# Patient Record
Sex: Female | Born: 1955 | Race: White | Hispanic: No | Smoking: Former smoker
Health system: Southern US, Community
[De-identification: ages and names within clinical notes are randomized; demographics above are authoritative.]

## PROBLEM LIST (undated history)

## (undated) DIAGNOSIS — J439 Emphysema, unspecified: Secondary | ICD-10-CM

## (undated) DIAGNOSIS — M810 Age-related osteoporosis without current pathological fracture: Secondary | ICD-10-CM

## (undated) DIAGNOSIS — E079 Disorder of thyroid, unspecified: Secondary | ICD-10-CM

## (undated) HISTORY — DX: Disorder of thyroid, unspecified: E07.9

## (undated) HISTORY — PX: TUBAL LIGATION: SHX77

## (undated) HISTORY — PX: APPENDECTOMY: SHX54

## (undated) HISTORY — DX: Age-related osteoporosis without current pathological fracture: M81.0

## (undated) HISTORY — PX: WISDOM TOOTH EXTRACTION: SHX21

## (undated) HISTORY — DX: Emphysema, unspecified: J43.9

---

## 2005-05-28 ENCOUNTER — Ambulatory Visit: Payer: Self-pay | Admitting: Unknown Physician Specialty

## 2007-06-13 ENCOUNTER — Ambulatory Visit: Payer: Self-pay | Admitting: Unknown Physician Specialty

## 2007-07-11 ENCOUNTER — Ambulatory Visit: Payer: Self-pay | Admitting: General Surgery

## 2007-07-14 ENCOUNTER — Ambulatory Visit: Payer: Self-pay | Admitting: Unknown Physician Specialty

## 2007-08-08 ENCOUNTER — Ambulatory Visit: Payer: Self-pay | Admitting: Unknown Physician Specialty

## 2007-08-17 ENCOUNTER — Ambulatory Visit: Payer: Self-pay | Admitting: Unknown Physician Specialty

## 2009-07-20 ENCOUNTER — Encounter (INDEPENDENT_AMBULATORY_CARE_PROVIDER_SITE_OTHER): Payer: Self-pay | Admitting: General Surgery

## 2009-07-20 ENCOUNTER — Inpatient Hospital Stay (HOSPITAL_COMMUNITY): Admission: EM | Admit: 2009-07-20 | Discharge: 2009-07-22 | Payer: Self-pay | Admitting: Emergency Medicine

## 2009-07-25 ENCOUNTER — Inpatient Hospital Stay (HOSPITAL_COMMUNITY): Admission: EM | Admit: 2009-07-25 | Discharge: 2009-08-02 | Payer: Self-pay | Admitting: Emergency Medicine

## 2010-05-25 LAB — URINALYSIS, ROUTINE W REFLEX MICROSCOPIC
Bilirubin Urine: NEGATIVE
Glucose, UA: NEGATIVE mg/dL
Hgb urine dipstick: NEGATIVE
Nitrite: NEGATIVE
Protein, ur: NEGATIVE mg/dL
Urobilinogen, UA: 0.2 mg/dL (ref 0.0–1.0)

## 2010-05-25 LAB — BASIC METABOLIC PANEL
BUN: 4 mg/dL — ABNORMAL LOW (ref 6–23)
CO2: 23 mEq/L (ref 19–32)
CO2: 24 mEq/L (ref 19–32)
CO2: 25 mEq/L (ref 19–32)
Calcium: 8 mg/dL — ABNORMAL LOW (ref 8.4–10.5)
Calcium: 8.3 mg/dL — ABNORMAL LOW (ref 8.4–10.5)
Calcium: 8.5 mg/dL (ref 8.4–10.5)
Calcium: 9.3 mg/dL (ref 8.4–10.5)
Chloride: 105 mEq/L (ref 96–112)
Chloride: 109 mEq/L (ref 96–112)
Chloride: 111 mEq/L (ref 96–112)
Creatinine, Ser: 0.6 mg/dL (ref 0.4–1.2)
Creatinine, Ser: 0.62 mg/dL (ref 0.4–1.2)
Creatinine, Ser: 0.66 mg/dL (ref 0.4–1.2)
GFR calc Af Amer: >60 mL/min (ref >60)
GFR calc Af Amer: >60 mL/min (ref >60)
GFR calc Af Amer: >60 mL/min (ref >60)
GFR calc non Af Amer: >60 mL/min (ref >60)
GFR calc non Af Amer: >60 mL/min (ref >60)
GFR calc non Af Amer: >60 mL/min (ref >60)
GFR calc non Af Amer: >60 mL/min (ref >60)
Glucose, Bld: 105 mg/dL — ABNORMAL HIGH (ref 70–99)
Glucose, Bld: 112 mg/dL — ABNORMAL HIGH (ref 70–99)
Glucose, Bld: 119 mg/dL — ABNORMAL HIGH (ref 70–99)
Glucose, Bld: 122 mg/dL — ABNORMAL HIGH (ref 70–99)
Glucose, Bld: 128 mg/dL — ABNORMAL HIGH (ref 70–99)
Potassium: 3.6 mEq/L (ref 3.5–5.1)
Potassium: 4.2 mEq/L (ref 3.5–5.1)
Sodium: 139 mEq/L (ref 135–145)
Sodium: 139 mEq/L (ref 135–145)
Sodium: 140 mEq/L (ref 135–145)
Sodium: 141 mEq/L (ref 135–145)
Sodium: 142 mEq/L (ref 135–145)

## 2010-05-25 LAB — CBC
HCT: 34.8 % — ABNORMAL LOW (ref 36.0–46.0)
HCT: 35.3 % — ABNORMAL LOW (ref 36.0–46.0)
HCT: 37.7 % (ref 36.0–46.0)
Hemoglobin: 11.6 g/dL — ABNORMAL LOW (ref 12.0–15.0)
Hemoglobin: 11.7 g/dL — ABNORMAL LOW (ref 12.0–15.0)
Hemoglobin: 11.9 g/dL — ABNORMAL LOW (ref 12.0–15.0)
Hemoglobin: 13.1 g/dL (ref 12.0–15.0)
MCHC: 33.6 g/dL (ref 30.0–36.0)
MCHC: 34.1 g/dL (ref 30.0–36.0)
MCHC: 34.7 g/dL (ref 30.0–36.0)
MCV: 100 fL (ref 78.0–100.0)
MCV: 99 fL (ref 78.0–100.0)
MCV: 99.7 fL (ref 78.0–100.0)
Platelets: 143 10*3/uL — ABNORMAL LOW (ref 150–400)
Platelets: 275 10*3/uL (ref 150–400)
Platelets: 346 10*3/uL (ref 150–400)
RBC: 3.5 MIL/uL — ABNORMAL LOW (ref 3.87–5.11)
RBC: 3.57 MIL/uL — ABNORMAL LOW (ref 3.87–5.11)
RBC: 4.35 MIL/uL (ref 3.87–5.11)
RDW: 12.7 % (ref 11.5–15.5)
RDW: 12.7 % (ref 11.5–15.5)
RDW: 12.7 % (ref 11.5–15.5)
WBC: 10.5 10*3/uL (ref 4.0–10.5)
WBC: 8.4 10*3/uL (ref 4.0–10.5)
WBC: 9.3 10*3/uL (ref 4.0–10.5)

## 2010-05-25 LAB — DIFFERENTIAL
Basophils Absolute: 0 10*3/uL (ref 0.0–0.1)
Basophils Absolute: 0 10*3/uL (ref 0.0–0.1)
Eosinophils Absolute: 0 10*3/uL (ref 0.0–0.7)
Eosinophils Absolute: 0.2 10*3/uL (ref 0.0–0.7)
Eosinophils Relative: 0 % (ref 0–5)
Eosinophils Relative: 3 % (ref 0–5)
Lymphocytes Relative: 18 % (ref 12–46)
Lymphs Abs: 0.9 10*3/uL (ref 0.7–4.0)
Lymphs Abs: 1.7 10*3/uL (ref 0.7–4.0)
Lymphs Abs: 2.1 10*3/uL (ref 0.7–4.0)
Monocytes Relative: 7 % (ref 3–12)
Neutro Abs: 6.5 10*3/uL (ref 1.7–7.7)
Neutrophils Relative %: 65 % (ref 43–77)
Neutrophils Relative %: 90 % — ABNORMAL HIGH (ref 43–77)

## 2010-05-25 LAB — COMPREHENSIVE METABOLIC PANEL
ALT: 27 U/L (ref 0–35)
AST: 23 U/L (ref 0–37)
Alkaline Phosphatase: 81 U/L (ref 39–117)
BUN: 5 mg/dL — ABNORMAL LOW (ref 6–23)
CO2: 29 mEq/L (ref 19–32)
Calcium: 8.4 mg/dL (ref 8.4–10.5)
Chloride: 93 mEq/L — ABNORMAL LOW (ref 96–112)
GFR calc non Af Amer: >60 mL/min (ref >60)
Glucose, Bld: 121 mg/dL — ABNORMAL HIGH (ref 70–99)
Potassium: 2.6 mEq/L — CL (ref 3.5–5.1)
Total Bilirubin: 1.2 mg/dL (ref 0.3–1.2)

## 2010-05-25 LAB — LIPASE, BLOOD: Lipase: 26 U/L (ref 11–59)

## 2010-05-25 LAB — POCT I-STAT, CHEM 8
BUN: 4 mg/dL — ABNORMAL LOW (ref 6–23)
Calcium, Ion: 1.06 mmol/L — ABNORMAL LOW (ref 1.12–1.32)
Hemoglobin: 14.3 g/dL (ref 12.0–15.0)
Sodium: 134 mEq/L — ABNORMAL LOW (ref 135–145)

## 2010-05-25 LAB — MAGNESIUM: Magnesium: 2.3 mg/dL (ref 1.5–2.5)

## 2011-02-02 ENCOUNTER — Ambulatory Visit: Payer: Self-pay | Admitting: Unknown Physician Specialty

## 2011-03-26 ENCOUNTER — Ambulatory Visit: Payer: Self-pay | Admitting: General Surgery

## 2016-04-23 ENCOUNTER — Other Ambulatory Visit: Payer: Self-pay | Admitting: Internal Medicine

## 2016-04-23 DIAGNOSIS — Z1231 Encounter for screening mammogram for malignant neoplasm of breast: Secondary | ICD-10-CM

## 2016-05-18 ENCOUNTER — Ambulatory Visit
Admission: RE | Admit: 2016-05-18 | Discharge: 2016-05-18 | Disposition: A | Discharge status: Home / Self Care | Payer: Managed Care, Other (non HMO) | Source: Ambulatory Visit | Attending: Internal Medicine | Admitting: Internal Medicine

## 2016-05-18 DIAGNOSIS — Z1231 Encounter for screening mammogram for malignant neoplasm of breast: Secondary | ICD-10-CM

## 2016-12-29 ENCOUNTER — Telehealth: Payer: Self-pay | Admitting: Acute Care

## 2016-12-29 DIAGNOSIS — Z122 Encounter for screening for malignant neoplasm of respiratory organs: Secondary | ICD-10-CM

## 2016-12-29 DIAGNOSIS — Z87891 Personal history of nicotine dependence: Secondary | ICD-10-CM

## 2016-12-30 NOTE — Telephone Encounter (Signed)
Will forward to the lung cancer pool

## 2017-01-05 ENCOUNTER — Encounter: Payer: Self-pay | Admitting: Acute Care

## 2017-01-05 ENCOUNTER — Ambulatory Visit (INDEPENDENT_AMBULATORY_CARE_PROVIDER_SITE_OTHER): Payer: Managed Care, Other (non HMO) | Admitting: Acute Care

## 2017-01-05 ENCOUNTER — Ambulatory Visit (INDEPENDENT_AMBULATORY_CARE_PROVIDER_SITE_OTHER)
Admission: RE | Admit: 2017-01-05 | Discharge: 2017-01-05 | Disposition: A | Discharge status: Home / Self Care | Payer: Managed Care, Other (non HMO) | Source: Ambulatory Visit | Attending: Acute Care | Admitting: Acute Care

## 2017-01-05 DIAGNOSIS — Z87891 Personal history of nicotine dependence: Secondary | ICD-10-CM

## 2017-01-05 DIAGNOSIS — Z122 Encounter for screening for malignant neoplasm of respiratory organs: Secondary | ICD-10-CM

## 2017-01-05 NOTE — Progress Notes (Signed)
Shared Decision Making Visit Lung Cancer Screening Program [phone])   Eligibility:  Age 61 y.o.  Pack Years Smoking History Calculation 77-pack-year smoking history (# packs/per year x # years smoked)  Recent History of coughing up blood  no  Unexplained weight loss? no ( >Than 15 pounds within the last 6 months )  Prior History Lung / other cancer no (Diagnosis within the last 5 years already requiring surveillance chest CT Scans).  Smoking Status Former Smoker  Former Smokers: Years since quit: 9 years  Quit Date: 2009  Visit Components:  Discussion included one or more decision making aids. yes  Discussion included risk/benefits of screening. yes  Discussion included potential follow up diagnostic testing for abnormal scans. yes  Discussion included meaning and risk of over diagnosis. yes  Discussion included meaning and risk of False Positives. yes  Discussion included meaning of total radiation exposure. yes  Counseling Included:  Importance of adherence to annual lung cancer LDCT screening. yes  Impact of comorbidities on ability to participate in the program. yes  Ability and willingness to under diagnostic treatment. yes  Smoking Cessation Counseling:  Current Smokers:   Discussed importance of smoking cessation. No.  Patient is a former smoker  Information about tobacco cessation classes and interventions provided to patient. yes  Patient provided with "ticket" for LDCT Scan. yes  Symptomatic Patient. no  Counseling  Diagnosis Code: Tobacco Use Z72.0  Asymptomatic Patient yes  Counseling (Intermediate counseling: > three minutes counseling) X3244  Former Smokers:   Discussed the importance of maintaining cigarette abstinence. yes  Diagnosis Code: Personal History of Nicotine Dependence. W10.272  Information about tobacco cessation classes and interventions provided to patient. Yes  Patient provided with "ticket" for LDCT Scan.  yes  Written Order for Lung Cancer Screening with LDCT placed in Epic. Yes (CT Chest Lung Cancer Screening Low Dose W/O CM) [id number] Z12.2-Screening of respiratory organs Z87.891-Personal history of nicotine dependence  I spent 25 minutes of face to face time with Drucie Ip discussing the risks and benefits of lung cancer screening. We viewed a power point together that explained in detail the above noted topics. We took the time to pause the power point at intervals to allow for questions to be asked and answered to ensure understanding. We discussed that she had taken the single most powerful action possible to decrease her risk of developing lung cancer when she quit smoking. I counseled Ms. Gehrig to remain smoke free, and to contact me if she ever had the desire to smoke again so that I can provide resources and tools to help support the effort to remain smoke free. We discussed the time and location of the scan, and that either  Doroteo Glassman RN or I will call with the results within  24-48 hours of receiving them.  Ms. Lowy has my card and contact information in the event she needs to speak with me, in addition to a copy of the power point we reviewed as a resource.  She verbalized understanding of all of the above and had no further questions upon leaving the office.     I explained to the patient that there has been a high incidence of coronary artery disease noted on these exams. I explained that this is a non-gated exam therefore degree or severity cannot be determined. This patient is not on statin therapy. I have asked the patient to follow-up with their PCP regarding any incidental finding of coronary artery disease and management with  diet or medication as they feel is clinically indicated. The patient verbalized understanding of the above and had no further questions.     Magdalen Spatz, NP 01/05/2017

## 2017-01-11 ENCOUNTER — Other Ambulatory Visit: Payer: Self-pay | Admitting: Acute Care

## 2017-01-11 DIAGNOSIS — Z122 Encounter for screening for malignant neoplasm of respiratory organs: Secondary | ICD-10-CM

## 2017-01-11 DIAGNOSIS — Z87891 Personal history of nicotine dependence: Secondary | ICD-10-CM

## 2017-07-05 ENCOUNTER — Other Ambulatory Visit: Payer: Self-pay | Admitting: Internal Medicine

## 2017-07-05 DIAGNOSIS — Z1231 Encounter for screening mammogram for malignant neoplasm of breast: Secondary | ICD-10-CM

## 2017-07-26 ENCOUNTER — Ambulatory Visit: Payer: Managed Care, Other (non HMO)

## 2017-08-09 ENCOUNTER — Ambulatory Visit
Admission: RE | Admit: 2017-08-09 | Discharge: 2017-08-09 | Disposition: A | Discharge status: Home / Self Care | Payer: Managed Care, Other (non HMO) | Source: Ambulatory Visit | Attending: Internal Medicine | Admitting: Internal Medicine

## 2017-08-09 DIAGNOSIS — Z1231 Encounter for screening mammogram for malignant neoplasm of breast: Secondary | ICD-10-CM

## 2018-01-17 ENCOUNTER — Ambulatory Visit (INDEPENDENT_AMBULATORY_CARE_PROVIDER_SITE_OTHER)
Admission: RE | Admit: 2018-01-17 | Discharge: 2018-01-17 | Disposition: A | Discharge status: Home / Self Care | Payer: Managed Care, Other (non HMO) | Source: Ambulatory Visit | Attending: Acute Care | Admitting: Acute Care

## 2018-01-17 DIAGNOSIS — Z87891 Personal history of nicotine dependence: Secondary | ICD-10-CM | POA: Diagnosis not present

## 2018-01-17 DIAGNOSIS — Z122 Encounter for screening for malignant neoplasm of respiratory organs: Secondary | ICD-10-CM

## 2018-01-19 ENCOUNTER — Other Ambulatory Visit: Payer: Self-pay | Admitting: Acute Care

## 2018-01-19 DIAGNOSIS — Z122 Encounter for screening for malignant neoplasm of respiratory organs: Secondary | ICD-10-CM

## 2018-01-19 DIAGNOSIS — Z87891 Personal history of nicotine dependence: Secondary | ICD-10-CM

## 2018-10-17 ENCOUNTER — Other Ambulatory Visit: Payer: Self-pay | Admitting: Internal Medicine

## 2018-10-17 DIAGNOSIS — Z1231 Encounter for screening mammogram for malignant neoplasm of breast: Secondary | ICD-10-CM

## 2018-12-05 ENCOUNTER — Other Ambulatory Visit: Payer: Self-pay

## 2018-12-05 ENCOUNTER — Ambulatory Visit
Admission: RE | Admit: 2018-12-05 | Discharge: 2018-12-05 | Disposition: A | Discharge status: Home / Self Care | Payer: Managed Care, Other (non HMO) | Source: Ambulatory Visit | Attending: Internal Medicine | Admitting: Internal Medicine

## 2018-12-05 DIAGNOSIS — Z1231 Encounter for screening mammogram for malignant neoplasm of breast: Secondary | ICD-10-CM

## 2019-01-31 ENCOUNTER — Other Ambulatory Visit: Payer: Self-pay

## 2019-01-31 ENCOUNTER — Ambulatory Visit (INDEPENDENT_AMBULATORY_CARE_PROVIDER_SITE_OTHER)
Admission: RE | Admit: 2019-01-31 | Discharge: 2019-01-31 | Disposition: A | Discharge status: Home / Self Care | Payer: Managed Care, Other (non HMO) | Source: Ambulatory Visit | Attending: Acute Care | Admitting: Acute Care

## 2019-01-31 DIAGNOSIS — Z87891 Personal history of nicotine dependence: Secondary | ICD-10-CM | POA: Diagnosis not present

## 2019-01-31 DIAGNOSIS — Z122 Encounter for screening for malignant neoplasm of respiratory organs: Secondary | ICD-10-CM

## 2019-02-09 ENCOUNTER — Telehealth: Payer: Self-pay | Admitting: Acute Care

## 2019-02-09 DIAGNOSIS — Z87891 Personal history of nicotine dependence: Secondary | ICD-10-CM

## 2019-02-09 DIAGNOSIS — Z122 Encounter for screening for malignant neoplasm of respiratory organs: Secondary | ICD-10-CM

## 2019-02-09 NOTE — Telephone Encounter (Signed)
Pt informed of CT results per Sarah Groce, NP.  PT verbalized understanding.  Copy sent to PCP.  Order placed for 1 yr f/u CT.  

## 2019-02-09 NOTE — Telephone Encounter (Signed)
Denise please call pt to advise on CT results.

## 2019-11-23 ENCOUNTER — Other Ambulatory Visit: Payer: Self-pay | Admitting: Internal Medicine

## 2019-11-23 DIAGNOSIS — Z0000 Encounter for general adult medical examination without abnormal findings: Secondary | ICD-10-CM

## 2019-12-10 ENCOUNTER — Ambulatory Visit
Admission: RE | Admit: 2019-12-10 | Discharge: 2019-12-10 | Disposition: A | Discharge status: Home / Self Care | Payer: Managed Care, Other (non HMO) | Source: Ambulatory Visit | Attending: Internal Medicine | Admitting: Internal Medicine

## 2019-12-10 ENCOUNTER — Other Ambulatory Visit: Payer: Self-pay

## 2019-12-10 DIAGNOSIS — Z0000 Encounter for general adult medical examination without abnormal findings: Secondary | ICD-10-CM

## 2020-02-04 ENCOUNTER — Other Ambulatory Visit: Payer: Self-pay

## 2020-02-04 ENCOUNTER — Ambulatory Visit (INDEPENDENT_AMBULATORY_CARE_PROVIDER_SITE_OTHER)
Admission: RE | Admit: 2020-02-04 | Discharge: 2020-02-04 | Disposition: A | Discharge status: Home / Self Care | Payer: Managed Care, Other (non HMO) | Source: Ambulatory Visit

## 2020-02-04 DIAGNOSIS — Z87891 Personal history of nicotine dependence: Secondary | ICD-10-CM

## 2020-02-04 DIAGNOSIS — Z122 Encounter for screening for malignant neoplasm of respiratory organs: Secondary | ICD-10-CM

## 2020-02-11 NOTE — Progress Notes (Signed)
Please call patient and let them  know their  low dose Ct was read as a Lung RADS 2: nodules that are benign in appearance and behavior with a very low likelihood of becoming a clinically active cancer due to size or lack of growth. Recommendation per radiology is for a repeat LDCT in 12 months. .Please let them  know we will order and schedule their  annual screening scan for 01/2020. Please let them  know there was notation of CAD on their  scan.  Please remind the patient  that this is a non-gated exam therefore degree or severity of disease  cannot be determined. Please have them  follow up with their PCP regarding potential risk factor modification, dietary therapy or pharmacologic therapy if clinically indicated. Pt.  is  currently on statin therapy. Please place order for annual  screening scan for  11.2022 and fax results to PCP. Thanks so much.

## 2020-02-18 ENCOUNTER — Other Ambulatory Visit: Payer: Self-pay | Admitting: *Deleted

## 2020-02-18 DIAGNOSIS — Z87891 Personal history of nicotine dependence: Secondary | ICD-10-CM

## 2020-04-07 ENCOUNTER — Ambulatory Visit: Payer: Managed Care, Other (non HMO) | Admitting: Cardiology

## 2020-04-07 ENCOUNTER — Encounter: Payer: Self-pay | Admitting: Cardiology

## 2020-04-07 ENCOUNTER — Other Ambulatory Visit: Payer: Self-pay

## 2020-04-07 VITALS — BP 130/76 | HR 60 | Temp 97.6°F | Resp 16 | Ht 66.0 in | Wt 190.0 lb

## 2020-04-07 DIAGNOSIS — R0789 Other chest pain: Secondary | ICD-10-CM | POA: Insufficient documentation

## 2020-04-07 NOTE — Progress Notes (Signed)
Patient referred by Deland Pretty, MD for coronary atherosclerosis  Subjective:   Katrina Mathews, female    DOB: Apr 30, 1955, 65 y.o.   MRN: [id number]  Chief Complaint  Patient presents with  . Coronary Artery Disease  . New Patient (Initial Visit)    Ref by Deland Pretty, MD    HPI  65 y.o. Caucasian female with prior tobacco abuse, referred for evaluation of coronary atherosclerosis  Patient is a retired Art gallery manager. She stays very active, with walking >3 miles, regular exercise including elliptical. She occasionally reports chest tightness in the beginning of exercise, does not get worse with further exertion. These episodes seem to occur mostly after she has eaten something. She is a former smoker, quit in 2009. Lung cancer screening CT scan showed coronary atherosclerosis.    History reviewed. No pertinent past medical history.   History reviewed. No pertinent surgical history.   Social History   Tobacco Use  Smoking Status Former Smoker  . Packs/day: 2.50  . Years: 31.00  . Pack years: 77.50  . Types: Cigarettes  . Quit date: 2009  . Years since quitting: 13.0  Smokeless Tobacco Never Used    Social History   Substance and Sexual Activity  Alcohol Use Yes  . Alcohol/week: 2.0 standard drinks  . Types: 2 Cans of beer per week   Comment: occasional     Family History  Problem Relation Age of Onset  . Alzheimer's disease Mother   . Heart failure Father   . Coronary artery disease Father      Current Outpatient Medications on File Prior to Visit  Medication Sig Dispense Refill  . Calcium 600-400 MG-UNIT CHEW Chew 600 mg by mouth.    . carboxymethylcellul-glycerin (REFRESH OPTIVE) 0.5-0.9 % ophthalmic solution 1 drop.    . Cholecalciferol (VITAMIN D3) 100000 UNIT/GM POWD 1 tablet    . Melatonin 5 MG CAPS Take by mouth.    . NON FORMULARY Take 500 mg by mouth daily. Sea buckthorn oil    . Omega-3 Fatty Acids (RA FISH OIL) 900 MG CAPS  Take by mouth.    . rosuvastatin (CRESTOR) 5 MG tablet Take 1 tablet by mouth daily.     No current facility-administered medications on file prior to visit.    Cardiovascular and other pertinent studies:  EKG 04/07/2020: Sinus rhythm 58 bpm Normal EKG  CT Chest 01/31/2020: 1. Lung-RADS 2S, benign appearance or behavior. Continue annual screening with low-dose chest CT without contrast in 12 months. 2. The "S" modifier above refers to potentially clinically significant non lung cancer related findings. Specifically, there is aortic atherosclerosis, in addition to left main coronary artery disease. Please note that although the presence of coronary artery calcium documents the presence of coronary artery disease, the severity of this disease and any potential stenosis cannot be assessed on this non-gated CT examination. Assessment for potential risk factor modification, dietary therapy or pharmacologic therapy may be warranted, if clinically indicated. 3. Mild diffuse bronchial wall thickening with mild centrilobular and paraseptal emphysema; imaging findings suggestive of underlying COPD.    Recent labs: 12/25/2019: Glucose 91, BUN/Cr 9/0.65. EGFR 95. Na/K 140/4.5. Rest of the CMP normal H/H 14/41. MCV 95. Platelets 201 Chol 138, TG 70, HDL 58, LDL 66 TSH 5.7 normal   Review of Systems  Cardiovascular: Positive for chest pain. Negative for dyspnea on exertion, leg swelling, palpitations and syncope.         Vitals:   04/07/20 0846  BP:  130/76  Pulse: 60  Resp: 16  Temp: 97.6 F (36.4 C)  SpO2: 98%     Body mass index is 30.67 kg/m. Filed Weights   04/07/20 0846  Weight: 190 lb (86.2 kg)     Objective:   Physical Exam Vitals and nursing note reviewed.  Constitutional:      General: She is not in acute distress. Neck:     Vascular: No JVD.  Cardiovascular:     Rate and Rhythm: Normal rate and regular rhythm.     Pulses: Normal pulses.     Heart  sounds: Normal heart sounds. No murmur heard.   Pulmonary:     Effort: Pulmonary effort is normal.     Breath sounds: Normal breath sounds. No wheezing or rales.  Musculoskeletal:     Right lower leg: No edema.     Left lower leg: No edema.        Assessment & Recommendations:   65 y.o. Caucasian female with prior tobacco abuse, referred for evaluation of coronary atherosclerosis  Chest tightness: Atypical, but has risk factors for CAD, with coronary atherosclerosis seen on CT chest.  Recommend exercise nuclear stress test and CT cardiac scoring for risk stratification.   Further recommendations after above testing  Thank you for referring the patient to [place]. Please feel free to contact with any questions.   Nigel Mormon, MD Pager: [phone] Office: [phone]

## 2020-04-21 ENCOUNTER — Other Ambulatory Visit: Payer: Self-pay

## 2020-04-21 ENCOUNTER — Ambulatory Visit: Payer: Managed Care, Other (non HMO)

## 2020-04-21 DIAGNOSIS — R0789 Other chest pain: Secondary | ICD-10-CM

## 2020-04-22 ENCOUNTER — Ambulatory Visit (HOSPITAL_COMMUNITY)
Admission: RE | Admit: 2020-04-22 | Discharge: 2020-04-22 | Disposition: A | Discharge status: Home / Self Care | Payer: Self-pay | Source: Ambulatory Visit | Attending: Internal Medicine | Admitting: Internal Medicine

## 2020-04-22 DIAGNOSIS — R0789 Other chest pain: Secondary | ICD-10-CM | POA: Insufficient documentation

## 2020-11-05 ENCOUNTER — Other Ambulatory Visit: Payer: Self-pay | Admitting: Internal Medicine

## 2020-11-05 DIAGNOSIS — Z1231 Encounter for screening mammogram for malignant neoplasm of breast: Secondary | ICD-10-CM

## 2020-12-16 ENCOUNTER — Ambulatory Visit
Admission: RE | Admit: 2020-12-16 | Discharge: 2020-12-16 | Disposition: A | Discharge status: Home / Self Care | Payer: Managed Care, Other (non HMO) | Source: Ambulatory Visit

## 2020-12-16 DIAGNOSIS — Z1231 Encounter for screening mammogram for malignant neoplasm of breast: Secondary | ICD-10-CM

## 2021-02-12 ENCOUNTER — Other Ambulatory Visit: Payer: Self-pay

## 2021-02-12 DIAGNOSIS — Z87891 Personal history of nicotine dependence: Secondary | ICD-10-CM

## 2021-02-19 ENCOUNTER — Other Ambulatory Visit: Payer: Self-pay

## 2021-02-19 ENCOUNTER — Ambulatory Visit (INDEPENDENT_AMBULATORY_CARE_PROVIDER_SITE_OTHER)
Admission: RE | Admit: 2021-02-19 | Discharge: 2021-02-19 | Disposition: A | Discharge status: Home / Self Care | Payer: Managed Care, Other (non HMO) | Source: Ambulatory Visit | Attending: Acute Care | Admitting: Acute Care

## 2021-02-19 DIAGNOSIS — Z87891 Personal history of nicotine dependence: Secondary | ICD-10-CM | POA: Diagnosis not present

## 2021-02-25 ENCOUNTER — Other Ambulatory Visit: Payer: Self-pay | Admitting: Acute Care

## 2021-02-25 DIAGNOSIS — Z87891 Personal history of nicotine dependence: Secondary | ICD-10-CM

## 2021-06-16 ENCOUNTER — Encounter: Payer: Self-pay | Admitting: Registered Nurse

## 2021-06-22 ENCOUNTER — Encounter: Payer: Self-pay | Admitting: Gastroenterology

## 2021-08-04 ENCOUNTER — Ambulatory Visit (AMBULATORY_SURGERY_CENTER): Payer: Managed Care, Other (non HMO) | Admitting: *Deleted

## 2021-08-04 VITALS — Ht 66.0 in | Wt 185.0 lb

## 2021-08-04 DIAGNOSIS — Z1211 Encounter for screening for malignant neoplasm of colon: Secondary | ICD-10-CM

## 2021-08-04 MED ORDER — NA SULFATE-K SULFATE-MG SULF 17.5-3.13-1.6 GM/177ML PO SOLN: 1.0000 | Freq: Once | ORAL | 0 refills | Status: AC

## 2021-08-04 NOTE — Progress Notes (Signed)

## 2021-08-20 ENCOUNTER — Encounter: Payer: Self-pay | Admitting: Gastroenterology

## 2021-08-24 ENCOUNTER — Encounter: Payer: Self-pay | Admitting: Gastroenterology

## 2021-08-24 ENCOUNTER — Ambulatory Visit (AMBULATORY_SURGERY_CENTER): Payer: Managed Care, Other (non HMO) | Admitting: Gastroenterology

## 2021-08-24 VITALS — BP 121/62 | HR 56 | Temp 96.9°F | Resp 13 | Ht 66.0 in | Wt 185.0 lb

## 2021-08-24 DIAGNOSIS — D125 Benign neoplasm of sigmoid colon: Secondary | ICD-10-CM

## 2021-08-24 DIAGNOSIS — Z1211 Encounter for screening for malignant neoplasm of colon: Secondary | ICD-10-CM | POA: Diagnosis present

## 2021-08-24 DIAGNOSIS — K635 Polyp of colon: Secondary | ICD-10-CM

## 2021-08-24 MED ORDER — SODIUM CHLORIDE 0.9 % IV SOLN: 500.0000 mL | Freq: Once | INTRAVENOUS | Status: DC

## 2021-08-24 NOTE — Op Note (Signed)
[place] Patient Name: Katrina Mathews Procedure Date: 08/24/2021 9:01 AM MRN: [id number] Endoscopist: Mauri Pole , MD Age: 66 Referring MD:  Date of Birth: [birth year] Gender: Female Account #: [id number] Procedure:                Colonoscopy Indications:              Screening for colorectal malignant neoplasm Medicines:                Monitored Anesthesia Care Procedure:                Pre-Anesthesia Assessment:                           - Prior to the procedure, a History and Physical                            was performed, and patient medications and                            allergies were reviewed. The patient's tolerance of                            previous anesthesia was also reviewed. The risks                            and benefits of the procedure and the sedation                            options and risks were discussed with the patient.                            All questions were answered, and informed consent                            was obtained. Prior Anticoagulants: The patient has                            taken no previous anticoagulant or antiplatelet                            agents. ASA Grade Assessment: II - A patient with                            mild systemic disease. After reviewing the risks                            and benefits, the patient was deemed in                            satisfactory condition to undergo the procedure.                           After obtaining informed consent, the colonoscope  was passed under direct vision. Throughout the                            procedure, the patient's blood pressure, pulse, and                            oxygen saturations were monitored continuously. The                            PCF-HQ190L Colonoscope was introduced through the                            anus and advanced to the the cecum, identified by                            appendiceal  orifice and ileocecal valve. The                            colonoscopy was performed without difficulty. The                            patient tolerated the procedure well. The quality                            of the bowel preparation was good. The ileocecal                            valve, appendiceal orifice, and rectum were                            photographed. Scope In: 9:18:03 AM Scope Out: 9:34:25 AM Scope Withdrawal Time: 0 hours 13 minutes 23 seconds  Total Procedure Duration: 0 hours 16 minutes 22 seconds  Findings:                 The perianal and digital rectal examinations were                            normal.                           A 3 mm polyp was found in the sigmoid colon. The                            polyp was sessile. The polyp was removed with a                            cold snare. Resection and retrieval were complete.                           Scattered small-mouthed diverticula were found in                            the sigmoid colon and descending colon.  Non-bleeding external and internal hemorrhoids were                            found during retroflexion. The hemorrhoids were                            small. Complications:            No immediate complications. Estimated Blood Loss:     Estimated blood loss was minimal. Impression:               - One 3 mm polyp in the sigmoid colon, removed with                            a cold snare. Resected and retrieved.                           - Diverticulosis in the sigmoid colon and in the                            descending colon.                           - Non-bleeding external and internal hemorrhoids. Recommendation:           - Patient has a contact number available for                            emergencies. The signs and symptoms of potential                            delayed complications were discussed with the                            patient. Return to  normal activities tomorrow.                            Written discharge instructions were provided to the                            patient.                           - Resume previous diet.                           - Continue present medications.                           - Await pathology results.                           - Repeat colonoscopy in 5-10 years for surveillance                            based on pathology results. Mauri Pole, MD 08/24/2021 9:43:09 AM This report has been signed electronically.

## 2021-08-24 NOTE — Progress Notes (Unsigned)
[facility] History and Physical   Primary Care Physician:  Deland Pretty, MD   Reason for Procedure:  Colorectal cancer screening  Plan:    Screening colonoscopy with possible interventions as needed     HPI: Katrina Mathews is a very pleasant 66 y.o. female here for screening colonoscopy. Denies any nausea, vomiting, abdominal pain, melena or bright red blood per rectum  The risks and benefits as well as alternatives of endoscopic procedure(s) have been discussed and reviewed. All questions answered. The patient agrees to proceed.    Past Medical History:  Diagnosis Date   Emphysema of lung ([place])    "mild"   Osteoporosis    Thyroid disease     Past Surgical History:  Procedure Laterality Date   APPENDECTOMY     may 2011   TUBAL LIGATION     1994   WISDOM TOOTH EXTRACTION     1975    Prior to Admission medications   Medication Sig Start Date End Date Taking? Authorizing Provider  Calcium 600-400 MG-UNIT CHEW Chew 600 mg by mouth daily.   Yes [provider]  Cholecalciferol (VITAMIN D3) 100000 UNIT/GM POWD daily.   Yes [provider]  levothyroxine (SYNTHROID) 50 MCG tablet Take 50 mcg by mouth every morning. 05/13/21  Yes [provider]  Melatonin 5 MG CAPS Take by mouth daily.   Yes [provider]  NON FORMULARY Take 500 mg by mouth daily. Sea buckthorn oil   Yes [provider]  Omega-3 Fatty Acids (RA FISH OIL) 900 MG CAPS Take by mouth.   Yes [provider]  rosuvastatin (CRESTOR) 5 MG tablet Take 1 tablet by mouth daily.   Yes [provider]  carboxymethylcellul-glycerin (REFRESH OPTIVE) 0.5-0.9 % ophthalmic solution Place 1 drop into both eyes as needed.    [provider]    Current Outpatient Medications  Medication Sig Dispense Refill   Calcium 600-400 MG-UNIT CHEW Chew 600 mg by mouth daily.     Cholecalciferol (VITAMIN D3) 100000 UNIT/GM POWD daily.      levothyroxine (SYNTHROID) 50 MCG tablet Take 50 mcg by mouth every morning.     Melatonin 5 MG CAPS Take by mouth daily.     NON FORMULARY Take 500 mg by mouth daily. Sea buckthorn oil     Omega-3 Fatty Acids (RA FISH OIL) 900 MG CAPS Take by mouth.     rosuvastatin (CRESTOR) 5 MG tablet Take 1 tablet by mouth daily.     carboxymethylcellul-glycerin (REFRESH OPTIVE) 0.5-0.9 % ophthalmic solution Place 1 drop into both eyes as needed.     Current Facility-Administered Medications  Medication Dose Route Frequency Provider Last Rate Last Admin   0.9 %  sodium chloride infusion  500 mL Intravenous Once Jovan Colligan, Venia Minks, MD        Allergies as of 08/24/2021 - Review Complete 08/24/2021  Allergen Reaction Noted   Bupropion  02/21/2020    Family History  Problem Relation Age of Onset   Alzheimer's disease Mother    Heart failure Father    Coronary artery disease Father    Colon cancer Neg Hx    Colon polyps Neg Hx    Crohn's disease Neg Hx    Esophageal cancer Neg Hx    Rectal cancer Neg Hx    Stomach cancer Neg Hx     Social History   Socioeconomic History   Marital status: Widowed    Spouse name: Not on file  Number of children: 3   Years of education: Not on file   Highest education level: Not on file  Occupational History   Not on file  Tobacco Use   Smoking status: Former    Packs/day: 2.50    Years: 31.00    Total pack years: 77.50    Types: Cigarettes    Quit date: 2009    Years since quitting: 14.4    Passive exposure: Past   Smokeless tobacco: Never  Vaping Use   Vaping Use: Never used  Substance and Sexual Activity   Alcohol use: Yes    Alcohol/week: 2.0 standard drinks of alcohol    Types: 2 Cans of beer per week    Comment: occasional   Drug use: Never   Sexual activity: Not on file  Other Topics Concern   Not on file  Social History Narrative   Not on file   Social Determinants of Health   Financial Resource Strain: Not on file  Food  Insecurity: Not on file  Transportation Needs: Not on file  Physical Activity: Not on file  Stress: Not on file  Social Connections: Not on file  Intimate Partner Violence: Not on file    Review of Systems:  All other review of systems negative except as mentioned in the HPI.  Physical Exam: Vital signs in last 24 hours: BP (!) 154/70   Pulse 62   Temp (!) 96.9 F (36.1 C)   Ht '5\' 6"'$  (1.676 m)   Wt 185 lb (83.9 kg)   SpO2 98%   BMI 29.86 kg/m  General:   Alert, NAD Lungs:  Clear .   Heart:  Regular rate and rhythm Abdomen:  Soft, nontender and nondistended. Neuro/Psych:  Alert and cooperative. Normal mood and affect. A and O x 3  Reviewed labs, radiology imaging, old records and pertinent past GI work up  Patient is appropriate for planned procedure(s) and anesthesia in an ambulatory setting   K. Denzil Magnuson , MD [phone]

## 2021-08-24 NOTE — Patient Instructions (Signed)
Please read handouts provided. Continue present medications. Await pathology results.   YOU HAD AN ENDOSCOPIC PROCEDURE TODAY AT THE  ENDOSCOPY CENTER:   Refer to the procedure report that was given to you for any specific questions about what was found during the examination.  If the procedure report does not answer your questions, please call your gastroenterologist to clarify.  If you requested that your care partner not be given the details of your procedure findings, then the procedure report has been included in a sealed envelope for you to review at your convenience later.  YOU SHOULD EXPECT: Some feelings of bloating in the abdomen. Passage of more gas than usual.  Walking can help get rid of the air that was put into your GI tract during the procedure and reduce the bloating. If you had a lower endoscopy (such as a colonoscopy or flexible sigmoidoscopy) you may notice spotting of blood in your stool or on the toilet paper. If you underwent a bowel prep for your procedure, you may not have a normal bowel movement for a few days.  Please Note:  You might notice some irritation and congestion in your nose or some drainage.  This is from the oxygen used during your procedure.  There is no need for concern and it should clear up in a day or so.  SYMPTOMS TO REPORT IMMEDIATELY:  Following lower endoscopy (colonoscopy or flexible sigmoidoscopy):  Excessive amounts of blood in the stool  Significant tenderness or worsening of abdominal pains  Swelling of the abdomen that is new, acute  Fever of 100F or higher   For urgent or emergent issues, a gastroenterologist can be reached at any hour by calling [phone]. Do not use MyChart messaging for urgent concerns.    DIET:  We do recommend a small meal at first, but then you may proceed to your regular diet.  Drink plenty of fluids but you should avoid alcoholic beverages for 24 hours.  ACTIVITY:  You should plan to take it easy  for the rest of today and you should NOT DRIVE or use heavy machinery until tomorrow (because of the sedation medicines used during the test).    FOLLOW UP: Our staff will call the number listed on your records 24-72 hours following your procedure to check on you and address any questions or concerns that you may have regarding the information given to you following your procedure. If we do not reach you, we will leave a message.  We will attempt to reach you two times.  During this call, we will ask if you have developed any symptoms of COVID 19. If you develop any symptoms (ie: fever, flu-like symptoms, shortness of breath, cough etc.) before then, please call [phone].  If you test positive for Covid 19 in the 2 weeks post procedure, please call and report this information to us.    If any biopsies were taken you will be contacted by phone or by letter within the next 1-3 weeks.  Please call us at [phone] if you have not heard about the biopsies in 3 weeks.    SIGNATURES/CONFIDENTIALITY: You and/or your care partner have signed paperwork which will be entered into your electronic medical record.  These signatures attest to the fact that that the information above on your After Visit Summary has been reviewed and is understood.  Full responsibility of the confidentiality of this discharge information lies with you and/or your care-partner.  

## 2021-08-24 NOTE — Progress Notes (Signed)
Called to room to assist during endoscopic procedure.  Patient ID and intended procedure confirmed with present staff. Received instructions for my participation in the procedure from the performing physician.  

## 2021-08-24 NOTE — Progress Notes (Unsigned)
PT taken to PACU. Monitors in place. VSS. Report given to RN. 

## 2021-08-24 NOTE — Progress Notes (Signed)
Pt's states no medical or surgical changes since previsit or office visit. 

## 2021-08-25 ENCOUNTER — Telehealth: Payer: Self-pay | Admitting: *Deleted

## 2021-08-25 NOTE — Telephone Encounter (Signed)
  Follow up Call-     08/24/2021    8:32 AM  Call back number  Post procedure Call Back phone  # [phone]  Permission to leave phone message Yes     Patient questions:  Do you have a fever, pain , or abdominal swelling? No. Pain Score  0 *  Have you tolerated food without any problems? Yes.    Have you been able to return to your normal activities? Yes.    Do you have any questions about your discharge instructions: Diet   No. Medications  No. Follow up visit  No.  Do you have questions or concerns about your Care? No.  Actions: * If pain score is 4 or above: No action needed, pain <4.

## 2021-09-16 ENCOUNTER — Encounter: Payer: Self-pay | Admitting: Gastroenterology

## 2022-02-19 ENCOUNTER — Ambulatory Visit
Admission: RE | Admit: 2022-02-19 | Discharge: 2022-02-19 | Disposition: A | Discharge status: Home / Self Care | Payer: Medicare Other | Source: Ambulatory Visit

## 2022-02-19 DIAGNOSIS — Z87891 Personal history of nicotine dependence: Secondary | ICD-10-CM

## 2022-02-19 IMAGING — CT CT CHEST LUNG CANCER SCREENING LOW DOSE W/O CM
2 of 4 series · 15 of 36 positions shown, 18 images · non-contrast
Comparison: Low-dose lung cancer screening chest CT 01/31/2019.

CLINICAL DATA: 64-year-old female former smoker (quit in [id number]) with
77 pack-year history of smoking. Lung cancer screening examination.

EXAM:
CT CHEST WITHOUT CONTRAST LOW-DOSE FOR LUNG CANCER SCREENING
TECHNIQUE: Multidetector CT imaging of the chest was performed following the
standard protocol without IV contrast.

[Series 3: lung thins 1.0 · axial · 0.65mm/px · z∈[-384,-89]mm · 12 of 325 slices shown, 15 images]
[im 15/325  mediastinal]
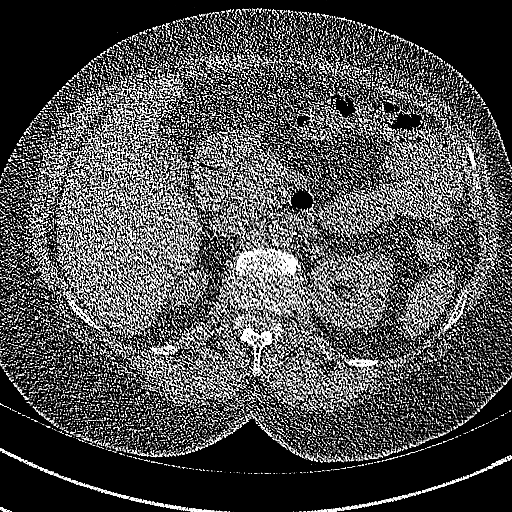
[im 15/325  lung]
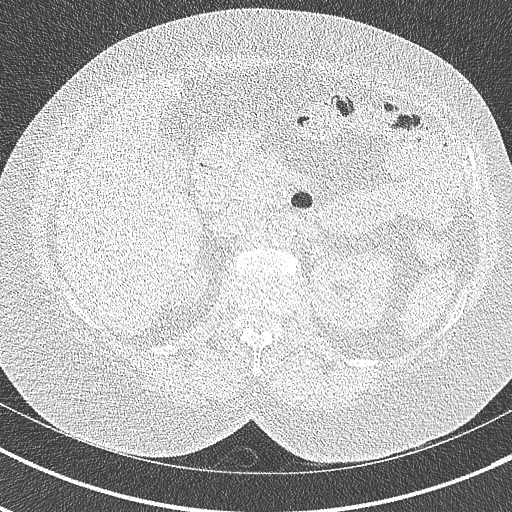
[im 45/325  lung]
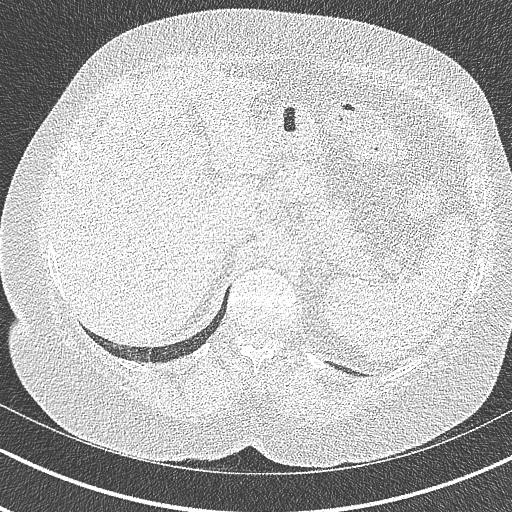
[im 74/325  lung]
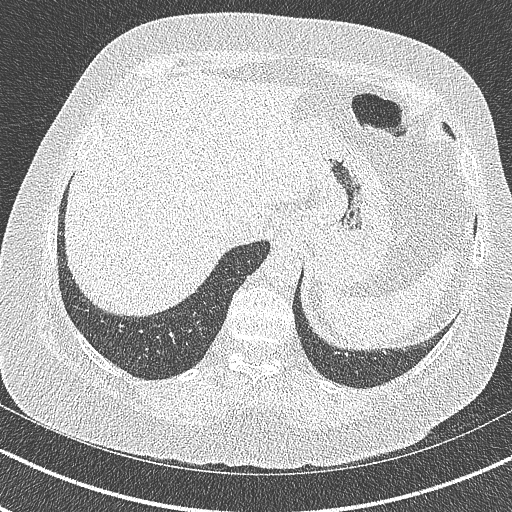
[im 104/325  lung]
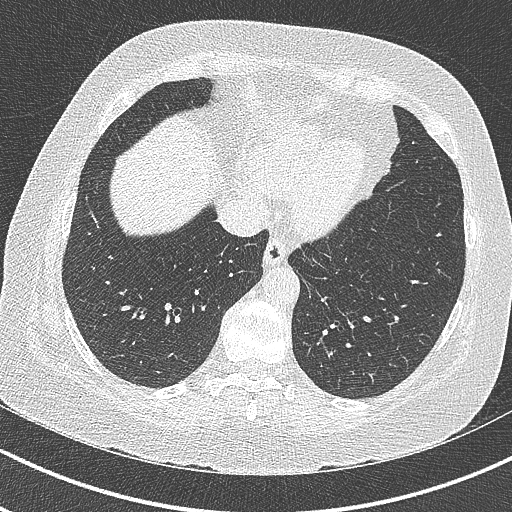
[im 118/325  mediastinal]
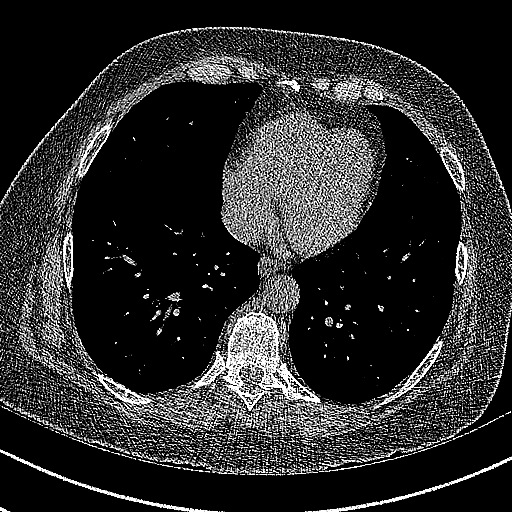
[im 118/325  lung]
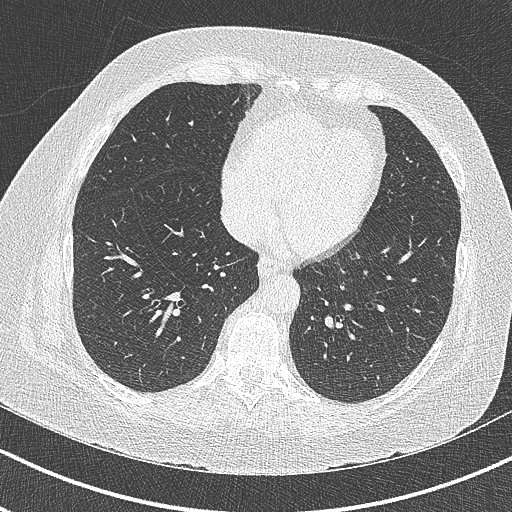
[im 148/325  lung]
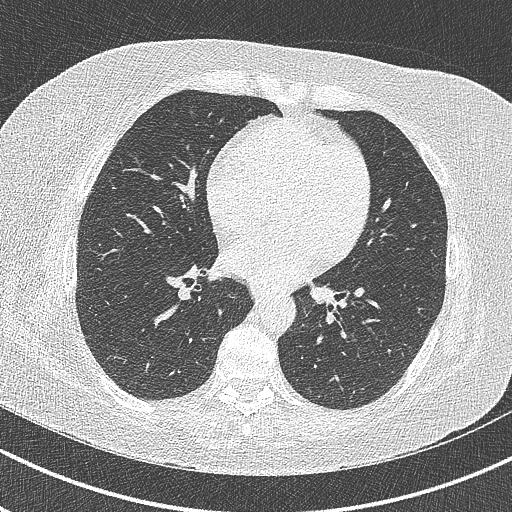
[im 177/325  lung]
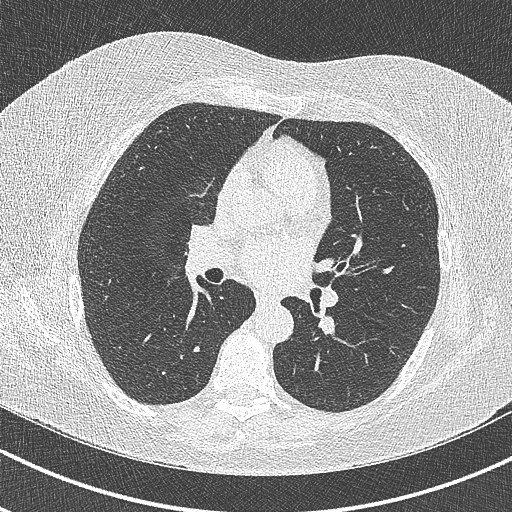
[im 207/325  lung]
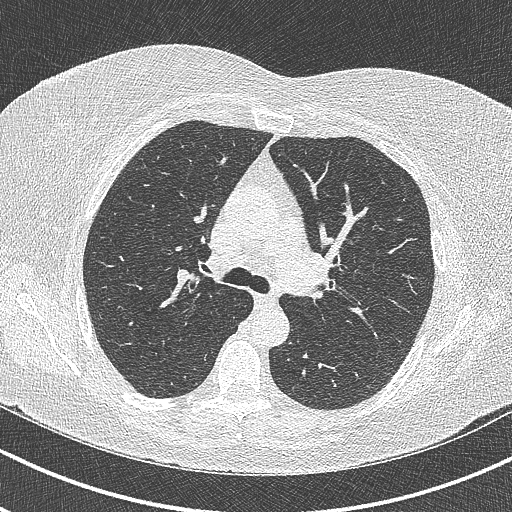
[im 221/325  mediastinal]
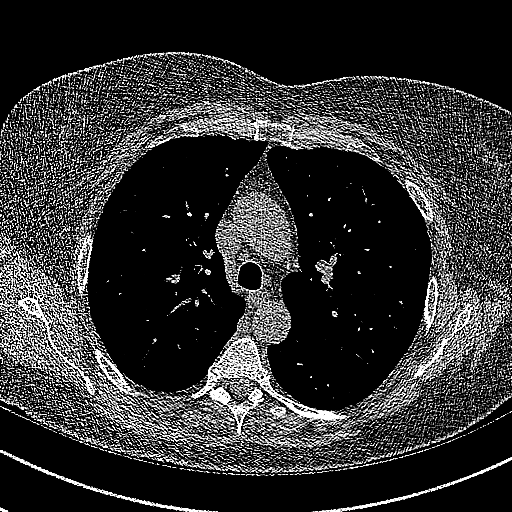
[im 221/325  lung]
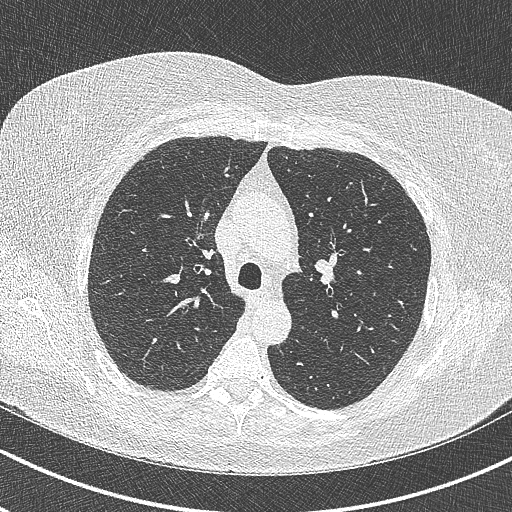
[im 251/325  lung]
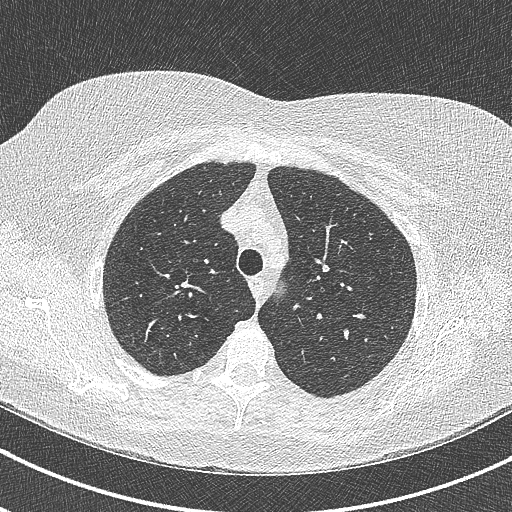
[im 280/325  lung]
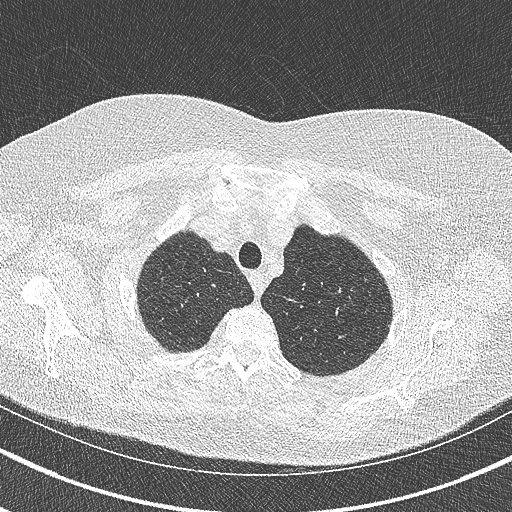
[im 310/325  lung]
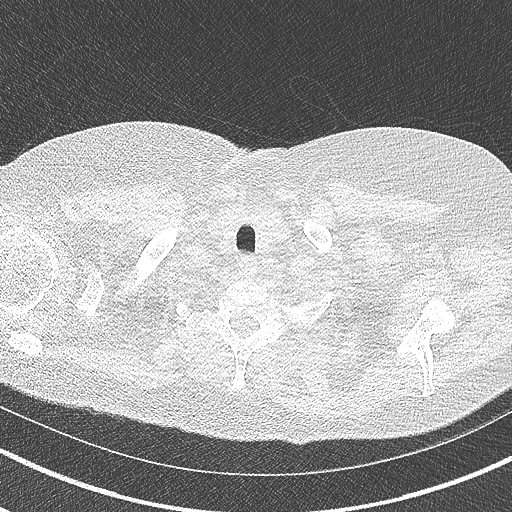

[Series 5: coronal · coronal · 0.64mm/px · 3 of 129 slices shown]
[im 26/129  lung]
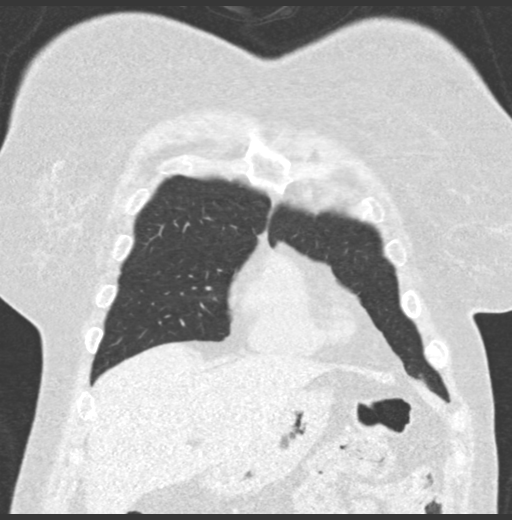
[im 52/129  lung]
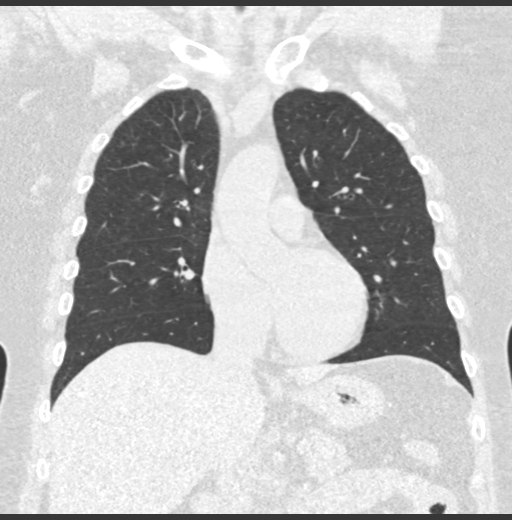
[im 77/129  lung]
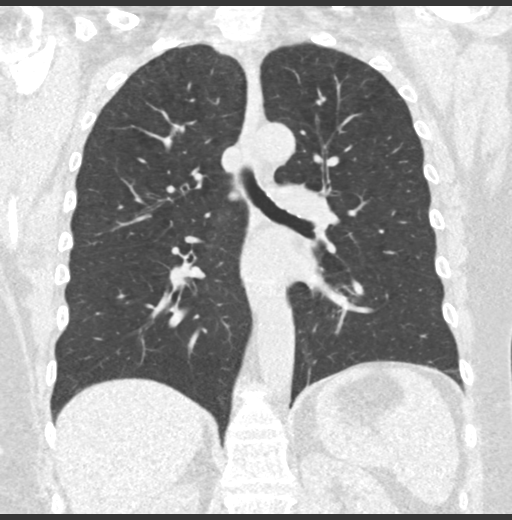

[15 of 36 positions shown; findings below may reference images not displayed]

FINDINGS: Cardiovascular: Heart size is normal. There is no significant
pericardial fluid, thickening or pericardial calcification. There is
aortic atherosclerosis, as well as atherosclerosis of the great
vessels of the mediastinum and the coronary arteries, including
calcified atherosclerotic plaque in the left main coronary artery.

Mediastinum/Nodes: No pathologically enlarged mediastinal or hilar
lymph nodes. Please note that accurate exclusion of hilar adenopathy
is limited on noncontrast CT scans. Esophagus is unremarkable in
appearance. No axillary lymphadenopathy.

Lungs/Pleura: Multiple small pulmonary nodules are noted throughout
the lungs bilaterally, largest of which is in the periphery of the
right upper lobe (axial image 79 of series 3), with a volume derived
mean diameter of 2.6 mm. No other larger more suspicious appearing
pulmonary nodules or masses are noted. No acute consolidative
airspace disease. No pleural effusions. Diffuse bronchial wall
thickening with mild centrilobular and paraseptal emphysema.

Upper Abdomen: Aortic atherosclerosis.

Musculoskeletal: There are no aggressive appearing lytic or blastic
lesions noted in the visualized portions of the skeleton.
IMPRESSION: 1. Lung-RADS 2S, benign appearance or behavior. Continue annual
screening with low-dose chest CT without contrast in 12 months.
2. The "S" modifier above refers to potentially clinically
significant non lung cancer related findings. Specifically, there is
aortic atherosclerosis, in addition to left main coronary artery
disease. Please note that although the presence of coronary artery
calcium documents the presence of coronary artery disease, the
severity of this disease and any potential stenosis cannot be
assessed on this non-gated CT examination. Assessment for potential
risk factor modification, dietary therapy or pharmacologic therapy
may be warranted, if clinically indicated.
3. Mild diffuse bronchial wall thickening with mild centrilobular
and paraseptal emphysema; imaging findings suggestive of underlying
COPD.

Aortic Atherosclerosis (38F5E-PM3.3) and Emphysema (38F5E-HU5.2).

## 2022-02-22 ENCOUNTER — Other Ambulatory Visit: Payer: Self-pay | Admitting: Acute Care

## 2022-02-22 DIAGNOSIS — Z122 Encounter for screening for malignant neoplasm of respiratory organs: Secondary | ICD-10-CM

## 2022-02-22 DIAGNOSIS — Z87891 Personal history of nicotine dependence: Secondary | ICD-10-CM

## 2022-06-19 ENCOUNTER — Other Ambulatory Visit: Payer: Self-pay

## 2022-06-19 ENCOUNTER — Emergency Department: Payer: No Typology Code available for payment source

## 2022-06-19 ENCOUNTER — Emergency Department
Admission: EM | Admit: 2022-06-19 | Discharge: 2022-06-19 | Disposition: A | Discharge status: Home / Self Care | Payer: No Typology Code available for payment source | Attending: Emergency Medicine | Admitting: Emergency Medicine

## 2022-06-19 DIAGNOSIS — S20211A Contusion of right front wall of thorax, initial encounter: Secondary | ICD-10-CM | POA: Insufficient documentation

## 2022-06-19 DIAGNOSIS — Y92410 Unspecified street and highway as the place of occurrence of the external cause: Secondary | ICD-10-CM | POA: Diagnosis not present

## 2022-06-19 DIAGNOSIS — S8001XA Contusion of right knee, initial encounter: Secondary | ICD-10-CM | POA: Insufficient documentation

## 2022-06-19 DIAGNOSIS — R0781 Pleurodynia: Secondary | ICD-10-CM | POA: Diagnosis present

## 2022-06-19 MED ORDER — METHOCARBAMOL 500 MG PO TABS: 500.0000 mg | ORAL_TABLET | Freq: Four times a day (QID) | ORAL | 0 refills | Status: AC

## 2022-06-19 MED ORDER — MELOXICAM 7.5 MG PO TABS: 7.5000 mg | ORAL_TABLET | Freq: Every day | ORAL | 0 refills | Status: AC
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
## 2023-01-31 ENCOUNTER — Other Ambulatory Visit: Payer: Self-pay | Admitting: Acute Care

## 2023-01-31 DIAGNOSIS — Z122 Encounter for screening for malignant neoplasm of respiratory organs: Secondary | ICD-10-CM

## 2023-01-31 DIAGNOSIS — Z87891 Personal history of nicotine dependence: Secondary | ICD-10-CM

## 2023-02-23 ENCOUNTER — Other Ambulatory Visit: Payer: Self-pay | Admitting: Internal Medicine

## 2023-02-23 DIAGNOSIS — R911 Solitary pulmonary nodule: Secondary | ICD-10-CM

## 2023-02-24 ENCOUNTER — Other Ambulatory Visit: Payer: Medicare Other
